# Patient Record
Sex: Female | Born: 1980 | Hispanic: Yes | Marital: Married | Smoking: Never smoker
Health system: Southern US, Community
[De-identification: ages and names within clinical notes are randomized; demographics above are authoritative.]

---

## 2017-01-04 ENCOUNTER — Other Ambulatory Visit (HOSPITAL_COMMUNITY)
Admission: RE | Admit: 2017-01-04 | Discharge: 2017-01-04 | Disposition: A | Discharge status: Home / Self Care | Payer: BLUE CROSS/BLUE SHIELD | Source: Ambulatory Visit | Attending: Family Medicine | Admitting: Family Medicine

## 2017-01-04 ENCOUNTER — Encounter: Payer: Self-pay | Admitting: Family Medicine

## 2017-01-04 ENCOUNTER — Ambulatory Visit: Payer: BLUE CROSS/BLUE SHIELD | Admitting: Family Medicine

## 2017-01-04 VITALS — BP 118/78 | HR 94 | Temp 98.5°F | Ht 59.0 in | Wt 101.5 lb

## 2017-01-04 DIAGNOSIS — R1032 Left lower quadrant pain: Secondary | ICD-10-CM

## 2017-01-04 DIAGNOSIS — R011 Cardiac murmur, unspecified: Secondary | ICD-10-CM | POA: Diagnosis not present

## 2017-01-04 DIAGNOSIS — R1030 Lower abdominal pain, unspecified: Secondary | ICD-10-CM | POA: Insufficient documentation

## 2017-01-04 LAB — URINALYSIS, ROUTINE W REFLEX MICROSCOPIC
Bilirubin Urine: NEGATIVE
Hgb urine dipstick: NEGATIVE
Ketones, ur: NEGATIVE
LEUKOCYTES UA: NEGATIVE
Nitrite: NEGATIVE
PH: 7.5 (ref 5.0–8.0)
Specific Gravity, Urine: 1.015 (ref 1.000–1.030)
TOTAL PROTEIN, URINE-UPE24: NEGATIVE
Urine Glucose: NEGATIVE
Urobilinogen, UA: 0.2 (ref 0.0–1.0)

## 2017-01-04 LAB — CBC
HCT: 41.6 % (ref 36.0–46.0)
HEMOGLOBIN: 13.5 g/dL (ref 12.0–15.0)
MCHC: 32.5 g/dL (ref 30.0–36.0)
MCV: 89.2 fl (ref 78.0–100.0)
Platelets: 225 10*3/uL (ref 150.0–400.0)
RBC: 4.66 Mil/uL (ref 3.87–5.11)
RDW: 14.1 % (ref 11.5–15.5)
WBC: 5.5 10*3/uL (ref 4.0–10.5)

## 2017-01-04 LAB — COMPREHENSIVE METABOLIC PANEL
ALK PHOS: 63 U/L (ref 39–117)
ALT: 12 U/L (ref 0–35)
AST: 14 U/L (ref 0–37)
Albumin: 4.9 g/dL (ref 3.5–5.2)
BILIRUBIN TOTAL: 0.8 mg/dL (ref 0.2–1.2)
BUN: 10 mg/dL (ref 6–23)
CO2: 30 mEq/L (ref 19–32)
Calcium: 9.7 mg/dL (ref 8.4–10.5)
Chloride: 102 mEq/L (ref 96–112)
Creatinine, Ser: 0.58 mg/dL (ref 0.40–1.20)
GFR: 123.27 mL/min (ref >60.00)
GLUCOSE: 96 mg/dL (ref 70–99)
Potassium: 4.2 mEq/L (ref 3.5–5.1)
Sodium: 137 mEq/L (ref 135–145)
TOTAL PROTEIN: 8.3 g/dL (ref 6.0–8.3)

## 2017-01-04 LAB — POCT URINE PREGNANCY: PREG TEST UR: NEGATIVE

## 2017-01-04 NOTE — Progress Notes (Signed)
Subjective:  Patient ID: Makayla MorganVivian Hart, female    DOB: Jun 03, 1980  Age: 36 y.o. MRN: [id number]  CC: Abdominal Pain and Back Pain   HPI Makayla Hart presents for evaluation of a week long history of left lower abdominal pain.  She denies nausea/vomiting, diarrhea, constipation, blood in her stool, melena or other changes in her bowel habits.  There is been no dysuria, urinary frequency, or vaginal discharge.  Her cycles usually started the first of the month and she has not had a cycle this month.  She has not been active sexually with her husband since her last menses.  She admits to being a little sad or blue over the last month or 2.  She denies any thoughts of self harm.  We asked her to fill out a PHQ 9.  Her son is now 38 years old but she says that he was in the hospital for 4 months with a blood dyscrasia that involved his platelets.  He is now doing well.  She was an avid runner before this pregnancy.  She is actually run marathons in the past.  She misses running.  She has no history of a heart murmur that she is aware of.  She has no chest pain or shortness of breath.  She does not smoke, drink alcohol or use illicit drugs.  She has a stable relationship with her husband.  She has an appointment with Dr. Allena KatzPatel her GYN provider on January 15.  History Makayla Hart has no past medical history on file.   She has no past surgical history on file.   Her family history is not on file.She reports that  has never smoked. she has never used smokeless tobacco. Her alcohol and drug histories are not on file.  No outpatient medications prior to visit.   No facility-administered medications prior to visit.     ROS Review of Systems  Constitutional: Negative for chills, diaphoresis, fatigue and unexpected weight change.  HENT: Negative.   Eyes: Negative.   Respiratory: Negative.  Negative for chest tightness and shortness of breath.   Cardiovascular: Negative for chest pain.  Gastrointestinal:  Positive for abdominal pain. Negative for abdominal distention, anal bleeding, blood in stool, constipation, diarrhea, nausea and vomiting.  Endocrine: Negative for polyphagia and polyuria.  Genitourinary: Negative for dysuria, frequency, genital sores, hematuria and vaginal discharge.  Musculoskeletal: Negative for arthralgias, gait problem and myalgias.  Skin: Negative for rash and wound.  Allergic/Immunologic: Negative for immunocompromised state.  Neurological: Negative for weakness and headaches.  Hematological: Does not bruise/bleed easily.  Psychiatric/Behavioral: Positive for dysphoric mood. Negative for behavioral problems, confusion, decreased concentration, hallucinations, self-injury and suicidal ideas.    Objective:  BP 118/78 (BP Location: Left Arm, Patient Position: Sitting, Cuff Size: Normal)   Pulse 94   Temp 98.5 F (36.9 C) (Oral)   Ht 4\' 11"  (1.499 m)   Wt 101 lb 8 oz (46 kg)   SpO2 97%   BMI 20.50 kg/m   Physical Exam  Constitutional: She is oriented to person, place, and time. She appears well-developed and well-nourished. No distress.  HENT:  Head: Normocephalic and atraumatic.  Right Ear: External ear normal.  Left Ear: External ear normal.  Mouth/Throat: Oropharynx is clear and moist. No oropharyngeal exudate.  Eyes: Conjunctivae are normal. Pupils are equal, round, and reactive to light. Right eye exhibits no discharge. Left eye exhibits no discharge. No scleral icterus.  Neck: Neck supple. No JVD present. No tracheal deviation present. No  thyromegaly present.  Cardiovascular: Normal rate and regular rhythm.  Murmur heard.  Decrescendo systolic murmur is present with a grade of 2/6. Early systolic murmur noted at left upper sternal border.  Pulmonary/Chest: Effort normal and breath sounds normal. No stridor.  Abdominal: Soft. Bowel sounds are normal. She exhibits no distension. There is no tenderness. There is no rebound and no guarding.    Lymphadenopathy:    She has no cervical adenopathy.  Neurological: She is alert and oriented to person, place, and time.  Skin: Skin is warm and dry. No rash noted. She is not diaphoretic. No erythema.  Psychiatric: She has a normal mood and affect. Her behavior is normal.      Assessment & Plan:   Makayla Hart was seen today for abdominal pain and back pain.  Diagnoses and all orders for this visit:  Left lower quadrant pain -     Urine cytology ancillary only -     POCT urine pregnancy -     CBC -     Comprehensive metabolic panel -     Urinalysis, Routine w reflex microscopic -     HIV antibody (with reflex)  Heart murmur -     Ambulatory referral to Cardiology   Makayla MorganVivian Hart does not currently have medications on file.  No orders of the defined types were placed in this encounter.    Follow-up: Return in about 1 week (around 01/11/2017).  Mliss SaxWilliam Alfred Kremer, MD

## 2017-01-05 LAB — URINE CYTOLOGY ANCILLARY ONLY
Chlamydia: NEGATIVE
Neisseria Gonorrhea: NEGATIVE
Trichomonas: NEGATIVE

## 2017-01-05 LAB — HIV ANTIBODY (ROUTINE TESTING W REFLEX): HIV: NONREACTIVE

## 2017-01-08 ENCOUNTER — Telehealth: Payer: Self-pay | Admitting: Family Medicine

## 2017-01-08 ENCOUNTER — Encounter: Payer: Self-pay | Admitting: Nurse Practitioner

## 2017-01-08 ENCOUNTER — Ambulatory Visit: Payer: Self-pay | Admitting: *Deleted

## 2017-01-08 ENCOUNTER — Ambulatory Visit: Payer: BLUE CROSS/BLUE SHIELD | Admitting: Nurse Practitioner

## 2017-01-08 VITALS — BP 110/70 | HR 67 | Temp 98.2°F | Ht 59.0 in | Wt 101.0 lb

## 2017-01-08 DIAGNOSIS — R1032 Left lower quadrant pain: Secondary | ICD-10-CM

## 2017-01-08 NOTE — Telephone Encounter (Signed)
Pt complain of pain in her belly button down her left side; pt states that she feels weak and feels bloated if she eats too much; pt also states that she has a watery discharge from her vagina; pt states that symptoms started when she had intercourse with her husband; she states that a cyst popped up on the side of her vagina but it went away; pt has appointment with Dr Doreene BurkeKremer on 01/08/17; however she would like to be seen today a and would also like to have a pelvic exam and swabs; she states that she is concerned about pelvic organ damage; pt offered and accepted an appointment 01/08/17 at 1000 with Alysia Pennaharlotte Nche; pt verbalizes understanding; spoke with Arrie Eastern[facility] pool regarding this appointment.      Reason for Disposition . Unusual vaginal discharge (e.g., bad smelling, yellow, green, or foamy-white)  Answer Assessment - Initial Assessment Questions 1. LOCATION: "Where does it hurt?"      Starts at belly button 2. RADIATION: "Does the pain shoot anywhere else?" (e.g., chest, back)     Down left side 3. ONSET: "When did the pain begin?" (e.g., minutes, hours or days ago)      Weeks ago 4. SUDDEN: "Gradual or sudden onset?"     gradual 5. PATTERN "Does the pain come and go, or is it constant?"    - If constant: "Is it getting better, staying the same, or worsening?"      (Note: Constant means the pain never goes away completely; most serious pain is constant and it progresses)     - If intermittent: "How long does it last?" "Do you have pain now?"     (Note: Intermittent means the pain goes away completely between bouts)     constant 6. SEVERITY: "How bad is the pain?"  (e.g., Scale 1-10; mild, moderate, or severe)   - MILD (1-3): doesn't interfere with normal activities, abdomen soft and not tender to touch    - MODERATE (4-7): interferes with normal activities or awakens from sleep, tender to touch    - SEVERE (8-10): excruciating pain, doubled over, unable to do any normal  activities      moderate 7. RECURRENT SYMPTOM: "Have you ever had this type of abdominal pain before?" If so, ask: "When was the last time?" and "What happened that time?"      no 8. CAUSE: "What do you think is causing the abdominal pain?"     ? STD 9. RELIEVING/AGGRAVATING FACTORS: "What makes it better or worse?" (e.g., movement, antacids, bowel movement)     Standing and walking 10. OTHER SYMPTOMS: "Has there been any vomiting, diarrhea, constipation, or urine problems?"       gas 11. PREGNANCY: "Is there any chance you are pregnant?" "When was your last menstrual period?"       No; seen in MD office on Thursday and negative pregnancy test  Protocols used: [facility]

## 2017-01-08 NOTE — Telephone Encounter (Signed)
Copied from CRM [id number]. Topic: Quick Communication - See Telephone Encounter >> Jan 08, 2017  8:08 AM Diana EvesHoyt, Maryann B wrote: CRM for notification. See Telephone encounter for:  Pt would like to have a call about her lab results.  01/08/17.

## 2017-01-08 NOTE — Progress Notes (Signed)
   Subjective:  Patient ID: Makayla MorganVivian Hart, female    DOB: March 04, 1980  Age: [age] y.o. MRN: [id number]  CC: Abdominal Pain (lower abd pain,discomfort,burning sensation,tender, worse on left side,had discharge with odor--going on for 2 mo--concern about infection--)   HPI  LLQ Abd pain x 2months: Intermittent. Reports vaginal discharge and odor at onset of symptoms (both resolved now) Unable to describe type of pain. No OTC medication used. Intercourse with husband only. No pain with intercourse. Reports she stopped having intercourse after having her 1131yrs old son. She resumed 2months ago. She is concerned about PID secondary to STI No intercourse in last 2months. Last LMP: approximately second week of November. Did not have a cycle this month. No constipation, no diarrhea No vaginal odor. No fever.  No outpatient medications prior to visit.   No facility-administered medications prior to visit.     ROS See HPI  Objective:  BP 110/70   Pulse 67   Temp 98.2 F (36.8 C)   Ht 4\' 11"  (1.499 m)   Wt 101 lb (45.8 kg)   LMP 11/20/2016 (Approximate) Comment: abstinence  SpO2 97%   BMI 20.40 kg/m   BP Readings from Last 3 Encounters:  01/08/17 110/70  01/04/17 118/78    Wt Readings from Last 3 Encounters:  01/08/17 101 lb (45.8 kg)  01/04/17 101 lb 8 oz (46 kg)    Physical Exam  Constitutional: She is oriented to person, place, and time. No distress.  Cardiovascular: Normal rate.  Pulmonary/Chest: Effort normal.  Abdominal: Soft. Bowel sounds are normal. She exhibits no distension. There is no tenderness. There is no guarding.  Musculoskeletal: She exhibits no edema.  Neurological: She is alert and oriented to person, place, and time.  Skin: Skin is warm and dry. No rash noted. No erythema.  Vitals reviewed.   Lab Results  Component Value Date   WBC 5.5 01/04/2017   HGB 13.5 01/04/2017   HCT 41.6 01/04/2017   PLT 225.0 01/04/2017   GLUCOSE 96 01/04/2017   ALT 12  01/04/2017   AST 14 01/04/2017   NA 137 01/04/2017   K 4.2 01/04/2017   CL 102 01/04/2017   CREATININE 0.58 01/04/2017   BUN 10 01/04/2017   CO2 30 01/04/2017    Assessment & Plan:   Makayla Hart was seen today for abdominal pain.  Diagnoses and all orders for this visit:  Left lower quadrant pain  I reviewed lab results with patient. I spent 30mins with Makayla Hart. 90% of the office visit was spent counseling patient labs results, Symptoms of PID, and other possible differential diagnosis (musculoskeletal strain or BV or pain related to ovulation). I offered to perform pelvic exam and wet prep. Makayla Hart declined, stating she has an appointment with GYN 01/30/17. I advised her to return to office if symptoms change or worsen. Advised to consider use of tylenol or ibuprofen for pain as needed.  Makayla MorganVivian Hart does not currently have medications on file.  No orders of the defined types were placed in this encounter.  Follow-up: Return if symptoms worsen or fail to improve.  Alysia Pennaharlotte Netasha Wehrli, NP

## 2017-01-08 NOTE — Telephone Encounter (Signed)
Noted! Thank you

## 2017-01-08 NOTE — Telephone Encounter (Signed)
I left a voicemail for patient letting her know labs are normal & to call back with any questions. CRM created & [facility].

## 2017-01-11 ENCOUNTER — Ambulatory Visit: Payer: BLUE CROSS/BLUE SHIELD | Admitting: Family Medicine

## 2017-01-11 LAB — URINE CYTOLOGY ANCILLARY ONLY: BACTERIAL VAGINITIS: NEGATIVE

## 2017-01-19 ENCOUNTER — Ambulatory Visit: Payer: BLUE CROSS/BLUE SHIELD | Admitting: Family Medicine

## 2017-01-19 ENCOUNTER — Encounter: Payer: Self-pay | Admitting: Family Medicine

## 2017-01-19 VITALS — BP 100/68 | HR 73 | Ht 59.0 in | Wt 99.2 lb

## 2017-01-19 DIAGNOSIS — R1032 Left lower quadrant pain: Secondary | ICD-10-CM | POA: Diagnosis not present

## 2017-01-19 NOTE — Progress Notes (Signed)
   Subjective:  Patient ID: Makayla Hart [place], female    DOB: [birth year]  Age: 37 y.o. MRN: [id number]  CC: Follow-up   HPI Makayla Hart Martucci presents  follow-up of left lower quadrant pain.  He continues to improve.  She denies fevers, chills, melena or bloody stool, or voiding symptoms.  She did see her GYN and had Pap and underwent a pelvic ultrasound.  Pap and pelvic ultrasound were normal.  Testing for STDs was negative she has no family history of colon cancer or diverticular disease.  She denies depression.  No outpatient medications prior to visit.   No facility-administered medications prior to visit.     ROS Review of Systems  Constitutional: Negative.   Respiratory: Negative.   Cardiovascular: Negative.   Gastrointestinal: Negative.  Negative for anal bleeding and blood in stool.  Endocrine: Negative for polyphagia and polyuria.  Genitourinary: Negative.  Negative for dyspareunia, dysuria, frequency, hematuria and vaginal discharge.  Psychiatric/Behavioral: Negative for dysphoric mood. The patient is not nervous/anxious.     Objective:  BP 100/68 (BP Location: Left Arm, Patient Position: Sitting, Cuff Size: Normal)   Pulse 73   Ht 4\' 11"  (1.499 m)   Wt 99 lb 4 oz (45 kg)   SpO2 96%   BMI 20.05 kg/m   BP Readings from Last 3 Encounters:  01/19/17 100/68  01/08/17 110/70  01/04/17 118/78    Wt Readings from Last 3 Encounters:  01/19/17 99 lb 4 oz (45 kg)  01/08/17 101 lb (45.8 kg)  01/04/17 101 lb 8 oz (46 kg)    Physical Exam  Constitutional: She is oriented to person, place, and time. She appears well-developed and well-nourished. No distress.  Cardiovascular: Normal rate and regular rhythm.  Murmur heard. Pulmonary/Chest: Effort normal and breath sounds normal.  Abdominal: Soft. Bowel sounds are normal. She exhibits no distension and no mass. There is no tenderness. There is no rebound and no guarding. Hernia confirmed negative in the right inguinal area and  confirmed negative in the left inguinal area.  Lymphadenopathy:       Right: No inguinal adenopathy present.       Left: No inguinal adenopathy present.  Neurological: She is alert and oriented to person, place, and time.  Skin: Skin is warm and dry. She is not diaphoretic.  Psychiatric: She has a normal mood and affect. Her behavior is normal.   I encouraged her to become more active and to begin to run again.  She will follow-up in 2 weeks as needed. Lab Results  Component Value Date   WBC 5.5 01/04/2017   HGB 13.5 01/04/2017   HCT 41.6 01/04/2017   PLT 225.0 01/04/2017   GLUCOSE 96 01/04/2017   ALT 12 01/04/2017   AST 14 01/04/2017   NA 137 01/04/2017   K 4.2 01/04/2017   CL 102 01/04/2017   CREATININE 0.58 01/04/2017   BUN 10 01/04/2017   CO2 30 01/04/2017    No results found.  Assessment & Plan:   Maureen RalphsVivian was seen today for follow-up.  Diagnoses and all orders for this visit:  Left lower quadrant pain   Makayla Hart Po[place] does not currently have medications on file.  No orders of the defined types were placed in this encounter.    Follow-up: No Follow-up on file.  Mliss SaxWilliam Alfred Monte Bronder, MD

## 2017-01-26 ENCOUNTER — Ambulatory Visit: Payer: BLUE CROSS/BLUE SHIELD | Admitting: Internal Medicine

## 2017-02-01 ENCOUNTER — Telehealth: Payer: Self-pay

## 2017-02-01 NOTE — Telephone Encounter (Signed)
Patient's husband would like to talk with you before patient's appointment.    Copied from CRM [id number]. Topic: General - Other >> Feb 01, 2017  2:04 PM Gerrianne ScalePayne, Angela L wrote: Reason for CRM: patient Husband Thayer OhmChris would like for Dr Doreene BurkeKremer to give him a call before his wife appointment tomorrow he would like to give the Doctor some in site on wives condition   Husband Thayer OhmChris phone number [phone]

## 2017-02-02 ENCOUNTER — Other Ambulatory Visit (HOSPITAL_COMMUNITY)
Admission: RE | Admit: 2017-02-02 | Discharge: 2017-02-02 | Disposition: A | Discharge status: Home / Self Care | Payer: BLUE CROSS/BLUE SHIELD | Source: Ambulatory Visit | Attending: Family Medicine | Admitting: Family Medicine

## 2017-02-02 ENCOUNTER — Encounter: Payer: Self-pay | Admitting: Family Medicine

## 2017-02-02 ENCOUNTER — Encounter: Payer: Self-pay | Admitting: Gastroenterology

## 2017-02-02 ENCOUNTER — Ambulatory Visit: Payer: BLUE CROSS/BLUE SHIELD | Admitting: Family Medicine

## 2017-02-02 VITALS — BP 112/54 | HR 64 | Temp 98.1°F | Ht 59.0 in | Wt 99.2 lb

## 2017-02-02 DIAGNOSIS — R1030 Lower abdominal pain, unspecified: Secondary | ICD-10-CM | POA: Diagnosis not present

## 2017-02-02 DIAGNOSIS — F341 Dysthymic disorder: Secondary | ICD-10-CM | POA: Diagnosis not present

## 2017-02-02 LAB — COMPREHENSIVE METABOLIC PANEL
ALBUMIN: 4.6 g/dL (ref 3.5–5.2)
ALT: 14 U/L (ref 0–35)
AST: 15 U/L (ref 0–37)
Alkaline Phosphatase: 55 U/L (ref 39–117)
BUN: 10 mg/dL (ref 6–23)
CHLORIDE: 102 meq/L (ref 96–112)
CO2: 29 mEq/L (ref 19–32)
CREATININE: 0.56 mg/dL (ref 0.40–1.20)
Calcium: 9.8 mg/dL (ref 8.4–10.5)
GFR: 128.31 mL/min (ref >60.00)
Glucose, Bld: 97 mg/dL (ref 70–99)
POTASSIUM: 5 meq/L (ref 3.5–5.1)
Sodium: 138 mEq/L (ref 135–145)
TOTAL PROTEIN: 7.6 g/dL (ref 6.0–8.3)
Total Bilirubin: 0.8 mg/dL (ref 0.2–1.2)

## 2017-02-02 LAB — CBC
HCT: 40.3 % (ref 36.0–46.0)
Hemoglobin: 13.3 g/dL (ref 12.0–15.0)
MCHC: 33.1 g/dL (ref 30.0–36.0)
MCV: 87.5 fl (ref 78.0–100.0)
PLATELETS: 229 10*3/uL (ref 150.0–400.0)
RBC: 4.61 Mil/uL (ref 3.87–5.11)
RDW: 14.2 % (ref 11.5–15.5)
WBC: 4.6 10*3/uL (ref 4.0–10.5)

## 2017-02-02 LAB — URINALYSIS, ROUTINE W REFLEX MICROSCOPIC
Bilirubin Urine: NEGATIVE
Hgb urine dipstick: NEGATIVE
Ketones, ur: NEGATIVE
Leukocytes, UA: NEGATIVE
Nitrite: NEGATIVE
RBC / HPF: NONE SEEN (ref 0–?)
SPECIFIC GRAVITY, URINE: 1.01 (ref 1.000–1.030)
TOTAL PROTEIN, URINE-UPE24: NEGATIVE
URINE GLUCOSE: NEGATIVE
Urobilinogen, UA: 0.2 (ref 0.0–1.0)
pH: 6.5 (ref 5.0–8.0)

## 2017-02-02 MED ORDER — AMOXICILLIN 500 MG PO CAPS: 500.0000 mg | ORAL_CAPSULE | Freq: Three times a day (TID) | ORAL | 0 refills | Status: DC

## 2017-02-02 NOTE — Progress Notes (Signed)
Subjective:  Patient ID: Makayla Hart, female    DOB: [birth year]  Age: 37 y.o. MRN: [id number]  CC: Follow-up (Patient is here today to follow-up with lower abd pain.  The pain has decreased on the LLQ (still present) but is intense on the RLQ. Denies any constipation or diarrhea.  She does states that she has increased hydration because of some left sided flank pain.)   HPI Makayla MorganVivian Bessire presents for follow-up of her ongoing lower abdominal pain and dysthymia.  She tells that the lower abdominal pain seems to have moved from the left side to the right side.  Her stooling patterns have not changed at all.  There is been no blood or pus in her stool.  She is still thinks there may be an unusual smell to her normal vaginal lobe.  She has been celibate for the past 3 months.  GYN checks, pelvic ultrasound and probes for GC" have all been negative.  Movement seems to bother her pain.  Rest seems to help it.  She said that she had taken her little boy for swimming lessons and thinks that he may have gotten strep throat from a swimming pool.  She wonders about a strep infection herself.  There is been no fever chills nausea vomiting or headache.  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  LMP was on the seventh of this month and unusually heavy. No outpatient medications prior to visit.   No facility-administered medications prior to visit.     ROS Review of Systems  Constitutional: Negative.   Respiratory: Negative.   Cardiovascular: Negative.   Gastrointestinal: Positive for abdominal pain. Negative for anal bleeding, blood in stool, constipation, diarrhea, nausea, rectal pain and vomiting.  Genitourinary: Positive for vaginal discharge. Negative for dysuria, hematuria and vaginal bleeding.  Musculoskeletal: Negative for myalgias.  Hematological: Does not bruise/bleed easily.  Psychiatric/Behavioral: Positive for dysphoric mood.     Objective:  BP (!) 112/54 (BP Location: Right Arm, Patient Position: Sitting, Cuff Size: Normal)   Pulse 64   Temp 98.1 F (36.7 C) (Oral)   Ht 4\' 11"  (1.499 m)   Wt 99 lb 3.2 oz (45 kg)   LMP 01/22/2017   SpO2 98%   BMI 20.04 kg/m   BP Readings from Last 3 Encounters:  02/02/17 (!) 112/54  01/19/17 100/68  01/08/17 110/70    Wt Readings from Last 3 Encounters:  02/02/17 99 lb 3.2 oz (45 kg)  01/19/17 99 lb 4 oz (45 kg)  01/08/17 101 lb (45.8 kg)    Physical Exam  Constitutional: She is oriented to person, place, and time. She appears well-developed and well-nourished. No distress.  HENT:  Head: Normocephalic and atraumatic.  Right Ear: External ear normal.  Left Ear: External ear normal.  Eyes: Right eye exhibits no discharge. Left eye exhibits no discharge. No scleral icterus.  Pulmonary/Chest: Effort normal.  Neurological: She is alert and oriented to person, place, and time.  Skin: Skin is warm and dry. She is not diaphoretic.  Psychiatric: She has a normal mood and affect. Her behavior is normal.    Lab Results  Component Value Date   WBC 5.5 01/04/2017   HGB 13.5 01/04/2017   HCT 41.6 01/04/2017   PLT 225.0 01/04/2017   GLUCOSE 96 01/04/2017   ALT 12 01/04/2017   AST 14 01/04/2017   NA 137 01/04/2017   K 4.2 01/04/2017  CL 102 01/04/2017   CREATININE 0.58 01/04/2017   BUN 10 01/04/2017   CO2 30 01/04/2017    No results found.  Assessment & Plan:   Connee was seen today for follow-up.  Diagnoses and all orders for this visit:  Lower abdominal pain -     CBC -     Comprehensive metabolic panel -     Urinalysis, Routine w reflex microscopic -     Urine Culture -     Urine cytology ancillary only -     Ambulatory referral to Gastroenterology -     amoxicillin (AMOXIL) 500 MG capsule; Take 1 capsule (500 mg total) by mouth 3 (three) times daily.  Dysthymia -     Ambulatory referral to Psychology   I am having Makayla Morgan start on  amoxicillin.  Meds ordered this encounter  Medications  . amoxicillin (AMOXIL) 500 MG capsule    Sig: Take 1 capsule (500 mg total) by mouth 3 (three) times daily.    Dispense:  30 capsule    Refill:  0     Follow-up: No Follow-up on file.  Mliss Sax, MD

## 2017-02-05 ENCOUNTER — Encounter: Payer: Self-pay | Admitting: Family Medicine

## 2017-02-05 LAB — URINE CYTOLOGY ANCILLARY ONLY
Bacterial vaginitis: NEGATIVE
CANDIDA VAGINITIS: NEGATIVE
Trichomonas: NEGATIVE

## 2017-02-05 LAB — URINE CULTURE
MICRO NUMBER: 90078385
SPECIMEN QUALITY:: ADEQUATE

## 2017-02-08 ENCOUNTER — Telehealth: Payer: Self-pay | Admitting: Family Medicine

## 2017-02-08 NOTE — Telephone Encounter (Signed)
Patient is aware of message below.

## 2017-02-08 NOTE — Telephone Encounter (Signed)
Called in inquiring about the antibiotic (Amoxicillin) being appropriate for the E Coli infection in her urine. "I've been on the Internet reading about the E Coli and I just wonder if I'm on the right antibiotic".     She got the antibiotic filled but has not started taking it.  She requested we double check with Dr. Doreene BurkeKremer before she starts the antibiotic.  I routed a note to Dr. Evangeline GulaKremer's nurse pool making him aware of the situation.

## 2017-02-08 NOTE — Telephone Encounter (Signed)
In this case it can be used. The isolated bacteria was sensitive to the amoxil. Please take it.

## 2017-02-08 NOTE — Telephone Encounter (Signed)
I left a voicemail for patient to call the office back. See Dr. Evangeline GulaKremer's note below. Crm created.

## 2017-02-16 ENCOUNTER — Ambulatory Visit: Payer: BLUE CROSS/BLUE SHIELD | Admitting: Gastroenterology

## 2017-02-16 ENCOUNTER — Encounter: Payer: Self-pay | Admitting: Gastroenterology

## 2017-02-16 VITALS — BP 90/60 | HR 68 | Ht 59.0 in | Wt 98.5 lb

## 2017-02-16 DIAGNOSIS — R140 Abdominal distension (gaseous): Secondary | ICD-10-CM

## 2017-02-16 DIAGNOSIS — R1030 Lower abdominal pain, unspecified: Secondary | ICD-10-CM

## 2017-02-16 NOTE — Patient Instructions (Signed)
You have been scheduled for an abdominal ultrasound at [facility] in the Radiology department   on 02/20/2017 at 9:00am. Please arrive 15 minutes prior to your appointment for registration. Make certain not to have anything to eat or drink 6 hours prior to your appointment. Should you need to reschedule your appointment, please contact radiology at [phone]. This test typically takes about 30 minutes to perform.  Take over the counter IBGard 1 capsule three times a day  Follow up as needed   If you are age 39 or older, your body mass index should be between 23-30. Your Body mass index is 19.89 kg/m. If this is out of the aforementioned range listed, please consider follow up with your Primary Care Provider.  If you are age 39 or younger, your body mass index should be between 19-25. Your Body mass index is 19.89 kg/m. If this is out of the aformentioned range listed, please consider follow up with your Primary Care Provider.    Thank you Marsa ArisKavitha Nandigam, MD

## 2017-02-16 NOTE — Progress Notes (Signed)
Makayla Hart    [id number]    02/24/1980  Primary Care Physician:Kremer, Talmadge CoventryWilliam Alfred, MD  Referring Physician: Mliss SaxKremer, William Alfred, MD [address] [place], [place] [address]  Chief complaint: Lower abdominal pain  HPI: 37 year old female patient visit with complaints of lower abdominal pain for past few weeks.  She had positive E. coli in the urine culture and is currently on antibiotics, she had some improvement of pain once she started taking antibiotics but continues to have the discomfort.  The pain radiates from suprapubic area in the lower abdomen to her back.  Denies any change in bowel habits.  Denies any constipation or diarrhea.  No melena or blood per rectum.  No dysphagia, heartburn, odynophagia, nausea or vomiting .  No fever or chills. When she strains excessively she occasionally feels hemorrhoids.  She is currently having regular bowel movements every day or every other day.  No rectal bleeding or other symptoms associated with hemorrhoids. No family history of IBD or colon cancer. She is planning to conceive and feels her symptoms started since she started having intercourse with her husband without condoms  Outpatient Encounter Medications as of 02/16/2017  Medication Sig  . amoxicillin (AMOXIL) 500 MG capsule Take 1 capsule (500 mg total) by mouth 3 (three) times daily.   No facility-administered encounter medications on file as of 02/16/2017.     Allergies as of 02/16/2017  . (No Known Allergies)    History reviewed. No pertinent past medical history.  No past surgical history on file.  Family History  Problem Relation Age of Onset  . Colon cancer Neg Hx   . Heart disease Neg Hx   . Stroke Neg Hx   . Diabetes Neg Hx     Social History   Socioeconomic History  . Marital status: Married    Spouse name: Not on file  . Number of children: 1  . Years of education: Not on file  . Highest education level: Not on file  Social Needs   . Financial resource strain: Not on file  . Food insecurity - worry: Not on file  . Food insecurity - inability: Not on file  . Transportation needs - medical: Not on file  . Transportation needs - non-medical: Not on file  Occupational History  . Not on file  Tobacco Use  . Smoking status: Never Smoker  . Smokeless tobacco: Never Used  Substance and Sexual Activity  . Alcohol use: No    Frequency: Never  . Drug use: No  . Sexual activity: Yes    Partners: Male    Birth control/protection: None  Other Topics Concern  . Not on file  Social History Narrative  . Not on file      Review of systems: Review of Systems  Constitutional: Negative for fever and chills.  HENT: Negative.   Eyes: Negative for blurred vision.  Respiratory: Negative for cough, shortness of breath and wheezing.   Cardiovascular: Negative for chest pain and palpitations.  Gastrointestinal: as per HPI Genitourinary: Negative for dysuria, urgency, frequency and hematuria.  Musculoskeletal: Negative for myalgias, back pain and joint pain.  Skin: Negative for itching and rash.  Neurological: Negative for dizziness, tremors, focal weakness, seizures and loss of consciousness.  Endo/Heme/Allergies: Positive for seasonal allergies.  Psychiatric/Behavioral: Negative for depression, suicidal ideas and hallucinations.  All other systems reviewed and are negative.   Physical Exam: Vitals:   02/16/17 0922  BP:  90/60  Pulse: 68   Body mass index is 19.89 kg/m. Gen:      No acute distress HEENT:  EOMI, sclera anicteric Neck:     No masses; no thyromegaly Lungs:    Clear to auscultation bilaterally; normal respiratory effort CV:         Regular rate and rhythm; no murmurs Abd:      + bowel sounds; soft, non-tender; no palpable masses, no distension Ext:    No edema; adequate peripheral perfusion Skin:      Warm and dry; no rash Neuro: alert and oriented x 3 Psych: normal mood and affect  Data  Reviewed:  Reviewed labs, radiology imaging, old records and pertinent past GI work up   Assessment and Plan/Recommendations:  37 year old female here with complaints of lower abdominal pain radiating to her back.  Positive urine culture with E. coli, currently on antibiotics. Reviewed recent labs, unremarkable. Her symptoms are likely secondary to UTI She has no specific GI complaints other than lower abdominal pain in the suprapubic region Obtain abdominal ultrasound to evaluate IB gard (peppermint oil) 1 capsule up to 3 times daily as needed for abdominal bloating and discomfort Return as needed   K. Scherry Ran , MD [phone] Mon-Fri 8a-5p [phone] after 5p, weekends, holidays  CC: Mliss Sax,*

## 2017-02-20 ENCOUNTER — Ambulatory Visit (HOSPITAL_BASED_OUTPATIENT_CLINIC_OR_DEPARTMENT_OTHER): Payer: BLUE CROSS/BLUE SHIELD

## 2017-02-23 ENCOUNTER — Telehealth: Payer: Self-pay

## 2017-02-23 NOTE — Telephone Encounter (Signed)
Patient would [facility] to [facility] because it will be cheaper than having [facility].

## 2017-02-26 ENCOUNTER — Ambulatory Visit (HOSPITAL_BASED_OUTPATIENT_CLINIC_OR_DEPARTMENT_OTHER): Payer: BLUE CROSS/BLUE SHIELD

## 2017-02-26 ENCOUNTER — Telehealth: Payer: Self-pay | Admitting: Gastroenterology

## 2017-02-26 DIAGNOSIS — R1030 Lower abdominal pain, unspecified: Secondary | ICD-10-CM

## 2017-02-26 NOTE — Telephone Encounter (Signed)
See previous phone note. Patient is requesting her ultrasound be rescheduled from [facility] to [facility] due to her insurance. [facility] [address] on 03/08/17 at 8:45am. Patient informed of date and time of appt and nothing to eat after midnight before ultrasound. Patient verbalized understanding.

## 2017-02-26 NOTE — Telephone Encounter (Signed)
Left a message for patient to return my call. 

## 2017-02-26 NOTE — Telephone Encounter (Signed)
See phone note from 02/26/17.

## 2017-02-27 ENCOUNTER — Encounter: Payer: Self-pay | Admitting: Internal Medicine

## 2017-02-27 ENCOUNTER — Telehealth: Payer: Self-pay | Admitting: Internal Medicine

## 2017-02-27 ENCOUNTER — Ambulatory Visit: Payer: BLUE CROSS/BLUE SHIELD | Admitting: Internal Medicine

## 2017-02-27 VITALS — BP 114/72 | HR 60 | Ht 59.0 in | Wt 97.0 lb

## 2017-02-27 DIAGNOSIS — R011 Cardiac murmur, unspecified: Secondary | ICD-10-CM | POA: Diagnosis not present

## 2017-02-27 DIAGNOSIS — I4510 Unspecified right bundle-branch block: Secondary | ICD-10-CM | POA: Insufficient documentation

## 2017-02-27 NOTE — Telephone Encounter (Signed)
Steward DroneBrenda calling with [facility], states that patient needs procedure code for echo. Please leave message if she does not answer.

## 2017-02-27 NOTE — Progress Notes (Signed)
OFFICE CONSULT NOTE  Chief Complaint:  Murmur  Primary Care Physician: Mliss Sax, MD  HPI:  Makayla Hart is a 37 y.o. female who is being seen today for the evaluation of murmur at the request of Mliss Sax,*.  This is a pleasant 37 year old female who is kindly referred for evaluation of a new murmur.  She reports not being told she had a murmur in the past.  She has been very physically active.  She says she has been a runner since about age 42 through high school and in college.  She also ran marathons.  She backed off from the somewhat when she developed an eye problem that was worsened by her running around 2014 or 2015 at the time when she had her first child.  She is now contemplating having another child.  She has been struggling recently with some abdominal pain and is been seen by GI and was also treated for a vaginitis.  Her murmur was heard by her GYN and she was referred for evaluation.  She denies any history of valvular heart disease.  There is no segment heart disease in the family.  She was not told that she had a murmur as a child.  She had her first EKG today that she can recall.  This demonstrated sinus rhythm at 60 with possible left atrial enlargement and right bundle branch block.  Personally reviewed this.  PMHx:  No relevant past cardiac history  FAMHx:  Family History  Problem Relation Age of Onset  . Colon cancer Neg Hx   . Heart disease Neg Hx   . Stroke Neg Hx   . Diabetes Neg Hx     SOCHx:   reports that  has never smoked. she has never used smokeless tobacco. She reports that she does not drink alcohol or use drugs.  ALLERGIES:  No Known Allergies  ROS: Pertinent items noted in HPI and remainder of comprehensive ROS otherwise negative.  HOME MEDS: Current Outpatient Medications on File Prior to Visit  Medication Sig Dispense Refill  . metroNIDAZOLE (FLAGYL) 500 MG tablet Take 1 tablet by mouth 2 (two) times daily.  0  .  Norethindrone Acetate-Ethinyl Estrad-FE (LOESTRIN 24 FE) 1-20 MG-MCG(24) tablet Take 1 tablet by mouth as directed.     No current facility-administered medications on file prior to visit.     LABS/IMAGING: No results found for this or any previous visit (from the past 48 hour(s)). No results found.  LIPID PANEL: No results found for: CHOL, TRIG, HDL, CHOLHDL, VLDL, LDLCALC, LDLDIRECT  WEIGHTS: Wt Readings from Last 3 Encounters:  02/27/17 97 lb (44 kg)  02/16/17 98 lb 8 oz (44.7 kg)  02/02/17 99 lb 3.2 oz (45 kg)    VITALS: BP 114/72   Pulse 60   Ht 4\' 11"  (1.499 m)   Wt 97 lb (44 kg)   BMI 19.59 kg/m   EXAM: General appearance: alert, no distress and Thin Neck: no carotid bruit, no JVD and thyroid not enlarged, symmetric, no tenderness/mass/nodules Lungs: clear to auscultation bilaterally Heart: regular rate and rhythm, S1, S2 normal and systolic murmur: early systolic 2/6, crescendo at 2nd right intercostal space Abdomen: soft, non-tender; bowel sounds normal; no masses,  no organomegaly Extremities: extremities normal, atraumatic, no cyanosis or edema Pulses: 2+ and symmetric Skin: Skin color, texture, turgor normal. No rashes or lesions Neurologic: Grossly normal Psych: Pleasant  EKG: Normal sinus rhythm, possible left atrial enlargement, RBBB- personally reviewed  ASSESSMENT:  1. Murmur 2. Abnormal EKG, possible left atrial enlargement 3. RBBB  PLAN: 1.   Mrs. Burich presents with a murmur which is loudest over the pulmonic area.  She does have a right bundle branch block and I wonder if this could be a pulmonic stenosis.  Her EKG suggests also possible left atrial enlargement.  There may be a separate soft systolic murmur over the mitral area as well.  I recommend an echocardiogram to further evaluate this.  Plan follow-up with me afterwards.  Will be helpful risk stratify her for any future pregnancies.  Thanks for the kind referral.  Chrystie NoseKenneth C. Hilty, MD,  [facility]FACC, FACP  [place]  [facility]  Medical Director of the Advanced Lipid Disorders &  Cardiovascular Risk Reduction Clinic Diplomate of the American Board of Clinical Lipidology Attending Cardiologist  Direct Dial: [phone]  Fax: [phone]  Website:  [email or web address]  Kenneth C Hilty 02/27/2017, 8:55 AM

## 2017-02-27 NOTE — Patient Instructions (Addendum)
Your physician has requested that you have an echocardi[facility]. Echocardiography is a painless test that uses sound waves to create images of your heart. It provides your doctor with information about the size and shape of your heart and how well your heart's chambers and valves are working. This procedure takes approximately one hour. There are no restrictions for this procedure.  Your physician recommends that you schedule a follow-up appointment after your echocardiogram

## 2017-03-01 ENCOUNTER — Ambulatory Visit (HOSPITAL_COMMUNITY): Payer: BLUE CROSS/BLUE SHIELD

## 2017-03-01 NOTE — Telephone Encounter (Signed)
Note to scheduling [place]che[place].

## 2017-03-06 ENCOUNTER — Ambulatory Visit: Payer: BLUE CROSS/BLUE SHIELD | Admitting: Psychology

## 2017-03-06 DIAGNOSIS — F332 Major depressive disorder, recurrent severe without psychotic features: Secondary | ICD-10-CM

## 2017-03-06 NOTE — Telephone Encounter (Signed)
Returned call to patient's husband. He states patient has become increasingly worried about her echo appointment and she is now having "symptoms" of occasional chest discomfort, warm sensation in her chest. Husband would like to know if this echo needs to be done STAT. Explained that per MD note, she did not report these symptoms and and echo was to f/up on a murmur. 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 He is concerned this is anxiety but worried they could be overlooking something. Currently, echo is scheduled at [facility] on 2/26 d/t cost - will only have to pay a co-pay vs $1000 at [facility] per husband.   Advised would notify MD.   Advised to seek ED eval for CP episodes >30 mins, worsening intensity or more frequent episodes. Patient's husband Thayer OhmChris is aware MD is out of the office

## 2017-03-06 NOTE — Telephone Encounter (Signed)
New message     Patient husband wants to speak with Eileen StanfordJenna, because his wife was scheduled for an Echo and they changed it until 03/13/17 because they dont have to pay copay if they wait until 03/13/17.    Spoke with Hayley- order doesn't say that it is a stat and there wasn't anything stating that patient needed it stat.    Wife is not worried should they pay the full price of the echo instead of waiting for the insurance to cover it and have it done STAT.

## 2017-03-07 ENCOUNTER — Ambulatory Visit: Payer: BLUE CROSS/BLUE SHIELD | Admitting: Family Medicine

## 2017-03-07 NOTE — Telephone Encounter (Signed)
Called patient's husband and provided MD recommendations. He asked if patient could have endocarditis. Explained that her lab work from jan 2019 showed normal WBC. She has no signs of active infection, no fever. He states she has had issues with infections. Reiterated MD advice of ED eval for acute symptoms. He asked what all would be checked in ED and I advised that I cannot answer this definitively as that would be up to the EDP.

## 2017-03-07 NOTE — Telephone Encounter (Signed)
I agree with those recommendations - a STAT echo cannot rule-out ischemic chest pain. Would proceed as scheduled but go to ER if symptoms are worse.  Dr. HRexene Edison

## 2017-03-08 ENCOUNTER — Ambulatory Visit
Admission: RE | Admit: 2017-03-08 | Discharge: 2017-03-08 | Disposition: A | Discharge status: Home / Self Care | Payer: BLUE CROSS/BLUE SHIELD | Source: Ambulatory Visit | Attending: Gastroenterology | Admitting: Gastroenterology

## 2017-03-08 DIAGNOSIS — R1030 Lower abdominal pain, unspecified: Secondary | ICD-10-CM

## 2017-03-09 ENCOUNTER — Other Ambulatory Visit (HOSPITAL_COMMUNITY): Payer: BLUE CROSS/BLUE SHIELD

## 2017-03-13 ENCOUNTER — Other Ambulatory Visit: Payer: Self-pay

## 2017-03-13 ENCOUNTER — Ambulatory Visit (INDEPENDENT_AMBULATORY_CARE_PROVIDER_SITE_OTHER): Payer: BLUE CROSS/BLUE SHIELD

## 2017-03-13 ENCOUNTER — Ambulatory Visit: Payer: BLUE CROSS/BLUE SHIELD | Admitting: Psychology

## 2017-03-13 DIAGNOSIS — F332 Major depressive disorder, recurrent severe without psychotic features: Secondary | ICD-10-CM

## 2017-03-13 DIAGNOSIS — R011 Cardiac murmur, unspecified: Secondary | ICD-10-CM | POA: Diagnosis not present

## 2017-03-13 DIAGNOSIS — I4510 Unspecified right bundle-branch block: Secondary | ICD-10-CM

## 2017-03-19 ENCOUNTER — Ambulatory Visit (INDEPENDENT_AMBULATORY_CARE_PROVIDER_SITE_OTHER): Payer: 59 | Admitting: Psychology

## 2017-03-19 DIAGNOSIS — F332 Major depressive disorder, recurrent severe without psychotic features: Secondary | ICD-10-CM | POA: Diagnosis not present

## 2017-03-23 ENCOUNTER — Ambulatory Visit: Payer: BLUE CROSS/BLUE SHIELD | Admitting: Family Medicine

## 2017-03-26 ENCOUNTER — Ambulatory Visit: Payer: 59 | Admitting: Internal Medicine

## 2017-03-26 ENCOUNTER — Ambulatory Visit (INDEPENDENT_AMBULATORY_CARE_PROVIDER_SITE_OTHER): Payer: 59 | Admitting: Family Medicine

## 2017-03-26 ENCOUNTER — Encounter: Payer: Self-pay | Admitting: Family Medicine

## 2017-03-26 ENCOUNTER — Encounter: Payer: Self-pay | Admitting: Internal Medicine

## 2017-03-26 VITALS — BP 118/70 | HR 66 | Ht 59.0 in | Wt 99.4 lb

## 2017-03-26 VITALS — BP 110/68 | HR 78 | Ht 59.0 in | Wt 98.1 lb

## 2017-03-26 DIAGNOSIS — I4510 Unspecified right bundle-branch block: Secondary | ICD-10-CM

## 2017-03-26 DIAGNOSIS — R0789 Other chest pain: Secondary | ICD-10-CM | POA: Insufficient documentation

## 2017-03-26 DIAGNOSIS — S39011A Strain of muscle, fascia and tendon of abdomen, initial encounter: Secondary | ICD-10-CM

## 2017-03-26 DIAGNOSIS — R010 Benign and innocent cardiac murmurs: Secondary | ICD-10-CM | POA: Diagnosis not present

## 2017-03-26 NOTE — Progress Notes (Signed)
OFFICE CONSULT NOTE  Chief Complaint:  Follow-up echo  Primary Care Physician: Mliss Sax, MD  HPI:  Makayla Hart is a 37 y.o. female who is being seen today for the evaluation of murmur at the request of Mliss Sax,*.  This is a pleasant 37 year old female who is kindly referred for evaluation of a new murmur.  She reports not being told she had a murmur in the past.  She has been very physically active.  She says she has been a runner since about age 54 through high school and in college.  She also ran marathons.  She backed off from the somewhat when she developed an eye problem that was worsened by her running around 2014 or 2015 at the time when she had her first child.  She is now contemplating having another child.  She has been struggling recently with some abdominal pain and is been seen by GI and was also treated for a vaginitis.  Her murmur was heard by her GYN and she was referred for evaluation.  She denies any history of valvular heart disease.  There is no segment heart disease in the family.  She was not told that she had a murmur as a child.  She had her first EKG today that she can recall.  This demonstrated sinus rhythm at 60 with possible left atrial enlargement and right bundle branch block.  Personally reviewed this.  03/26/2017  Mrs. Mohrmann returns today for follow-up.  She underwent an echo for murmur which sounded slightly louder than a physiologic murmur.  It was loudest in the pulmonic area.  Echo however was normal with no significant valvular abnormalities and vigorous diastolic function.  She has reported atypical chest discomfort which comes and goes and seems to be worse when she is under stress.  I suspect this is musculoskeletal.  She has a long-standing history of very good physical activity including running marathons and her heart is in the condition I would expect for that.  PMHx:  No relevant past cardiac history  FAMHx:  Family  History  Problem Relation Age of Onset  . Colon cancer Neg Hx   . Heart disease Neg Hx   . Stroke Neg Hx   . Diabetes Neg Hx     SOCHx:   reports that  has never smoked. she has never used smokeless tobacco. She reports that she does not drink alcohol or use drugs.  ALLERGIES:  No Known Allergies  ROS: Pertinent items noted in HPI and remainder of comprehensive ROS otherwise negative.  HOME MEDS: No current outpatient medications on file prior to visit.   No current facility-administered medications on file prior to visit.     LABS/IMAGING: No results found for this or any previous visit (from the past 48 hour(s)). No results found.  LIPID PANEL: No results found for: CHOL, TRIG, HDL, CHOLHDL, VLDL, LDLCALC, LDLDIRECT  WEIGHTS: Wt Readings from Last 3 Encounters:  03/26/17 99 lb 6.4 oz (45.1 kg)  02/27/17 97 lb (44 kg)  02/16/17 98 lb 8 oz (44.7 kg)    VITALS: BP 118/70   Pulse 66   Ht 4\' 11"  (1.499 m)   Wt 99 lb 6.4 oz (45.1 kg)   SpO2 99%   BMI 20.08 kg/m   EXAM: Deferred  EKG: Deferred  ASSESSMENT: 1. Murmur 2. Abnormal EKG, possible left atrial enlargement 3. RBBB 4. Atypical chest pain  PLAN: 1.   Mrs. Lengyel returns today for follow-up of  her echo.  I am pleased to report her echo was normal.  She does have a right bundle branch block on EKG however this is likely congenital.  I do not think it relates to any ischemia.  No further workup is necessary.  She had mentioned some atypical chest discomfort.  If not with exertion or relieved by rest and seems to be worse with anxiety.  I suspect this is related to that.  She should monitor it with physical activity.  Follow-up as needed.  Chrystie NoseKenneth C. Hilty, MD, [facility]ACC, FACP  [place]  [facility]  Medical Director of the Advanced Lipid Disorders &  Cardiovascular Risk Reduction Clinic Diplomate of the American Board of Clinical Lipidology Attending Cardiologist  Direct Dial: [phone]  Fax:  [phone]  Website:  [email or web address]  Kenneth C Hilty 03/26/2017, 10:17 AM

## 2017-03-26 NOTE — Progress Notes (Signed)
Subjective:  Patient ID: Makayla Hart, female    DOB: 08/10/1980  Age: 37 y.o. MRN: [id number]  CC: Follow-up   HPI Makayla Hart presents for follow-up of her lower abdominal pain.  It is somewhat improved.  She did have a GI consult and abdominal and pelvic ultrasounds were both normal.  She did see cardiology for her heart murmur and right bundle branch block and they recommended reassurance for her.  She tells me the treatment with Flagyl was somewhat helpful for her.  She has had 3 counseling sessions and is planning to do more.  She is experienced some left sided chest pressure pressure with tingling in her hands and feet.  She admits to being a little sad and frustrated with all the symptoms that she is experiencing.  No outpatient medications prior to visit.   No facility-administered medications prior to visit.     ROS Review of Systems  Constitutional: Negative.   HENT: Negative.   Eyes: Negative.   Respiratory: Negative for shortness of breath and wheezing.   Cardiovascular: Positive for chest pain. Negative for palpitations and leg swelling.  Gastrointestinal: Positive for abdominal pain. Negative for abdominal distention, anal bleeding, blood in stool, constipation, diarrhea, nausea, rectal pain and vomiting.  Genitourinary: Negative.  Negative for vaginal discharge.  Skin: Negative.   Allergic/Immunologic: Negative for immunocompromised state.  Neurological: Negative for weakness and headaches.  Hematological: Does not bruise/bleed easily.    Objective:  BP 110/68 (BP Location: Right Arm, Patient Position: Sitting, Cuff Size: Normal)   Pulse 78   Ht 4\' 11"  (1.499 m)   Wt 98 lb 2 oz (44.5 kg)   SpO2 99%   BMI 19.82 kg/m   BP Readings from Last 3 Encounters:  03/26/17 110/68  03/26/17 118/70  02/27/17 114/72    Wt Readings from Last 3 Encounters:  03/26/17 98 lb 2 oz (44.5 kg)  03/26/17 99 lb 6.4 oz (45.1 kg)  02/27/17 97 lb (44 kg)    Physical Exam    Constitutional: She is oriented to person, place, and time. She appears well-developed and well-nourished. No distress.  HENT:  Head: Normocephalic and atraumatic.  Right Ear: External ear normal.  Left Ear: External ear normal.  Eyes: Conjunctivae are normal. Right eye exhibits no discharge. Left eye exhibits no discharge. No scleral icterus.  Neck: Neck supple. No JVD present. No tracheal deviation present. No thyromegaly present.  Cardiovascular: Normal rate, regular rhythm and normal heart sounds.  Pulmonary/Chest: Effort normal and breath sounds normal. No stridor. No respiratory distress. She has no wheezes.  Abdominal: Bowel sounds are normal.  Lymphadenopathy:    She has no cervical adenopathy.  Neurological: She is alert and oriented to person, place, and time.  Skin: Skin is warm and dry. She is not diaphoretic.  Psychiatric: She has a normal mood and affect. Her behavior is normal.    Lab Results  Component Value Date   WBC 4.6 02/02/2017   HGB 13.3 02/02/2017   HCT 40.3 02/02/2017   PLT 229.0 02/02/2017   GLUCOSE 97 02/02/2017   ALT 14 02/02/2017   AST 15 02/02/2017   NA 138 02/02/2017   K 5.0 02/02/2017   CL 102 02/02/2017   CREATININE 0.56 02/02/2017   BUN 10 02/02/2017   CO2 29 02/02/2017    US Abdomen Complete  Result Date: 03/08/2017 CLINICAL DATA:  Lower abdominal pain for 5 months. EXAM: ABDOMEN ULTRASOUND COMPLETE COMPARISON:  None. FINDINGS: Gallbladder: No gallstones or wall  thickening visualized. No sonographic Murphy sign noted by sonographer. Common bile duct: Diameter: 3 mm Liver: No focal lesion identified. Within normal limits in parenchymal echogenicity. Portal vein is patent on color Doppler imaging with normal direction of blood flow towards the liver. IVC: No abnormality visualized. Pancreas: Visualized portion unremarkable. Spleen: Size and appearance within normal limits. Right Kidney: Length: 10.3 cm. Echogenicity within normal limits. No mass  or hydronephrosis visualized. Left Kidney: Length: 10.0 cm. Echogenicity within normal limits. No mass or hydronephrosis visualized. Abdominal aorta: No aneurysm visualized. Other findings: None. IMPRESSION: Unremarkable abdominal ultrasound. Electronically Signed   By: Sebastian AcheAllen  Grady M.D.   On: 03/08/2017 10:02    Assessment & Plan:   Makayla Hart was seen today for follow-up.  Diagnoses and all orders for this visit:  Strain of abdominal wall, initial encounter -     Ambulatory referral to Physical Therapy   [name] does not currently have medications on file.  No orders of the defined types were placed in this encounter.  Once he has offered medical treatment and the patient declines.  She will continue with counseling.  She agrees to see physical therapy for core strengthening.  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  In the past associated with [employer].  She will let us know who that is and we will refer her there.  Follow-up: Return in about 3 months (around 06/26/2017).  Mliss SaxWilliam Alfred Ladamien Rammel, MD

## 2017-03-26 NOTE — Patient Instructions (Signed)
Medication Instructions:  Continue current medications  If you need a refill on your cardiac medications before your next appointment, please call your pharmacy.  Labwork: None Ordered  Testing/Procedures: None Ordered  Follow-Up: Your physician wants you to follow-up in: As Needed.      Thank you for choosing [facility]!!       

## 2017-04-05 ENCOUNTER — Ambulatory Visit: Payer: 59 | Admitting: Psychology

## 2017-04-20 ENCOUNTER — Ambulatory Visit: Payer: 59 | Admitting: Psychology

## 2017-04-20 DIAGNOSIS — F332 Major depressive disorder, recurrent severe without psychotic features: Secondary | ICD-10-CM

## 2017-05-10 ENCOUNTER — Ambulatory Visit: Payer: 59 | Admitting: Psychology

## 2018-05-08 ENCOUNTER — Telehealth: Payer: Self-pay | Admitting: Family Medicine

## 2018-05-08 NOTE — Telephone Encounter (Signed)
Called but could not leave vm. Calling to schedule virtual visit with Makayla Hart.

## 2020-01-09 IMAGING — US US ABDOMEN COMPLETE
1 series · 14 of 25 positions shown · non-contrast
Comparison: None.

CLINICAL DATA: Lower abdominal pain for 5 months.

EXAM:
ABDOMEN ULTRASOUND COMPLETE

[Series 1: us abdomen complete · 0.11mm/px · 14 of 89 slices shown]
[im 1/89]
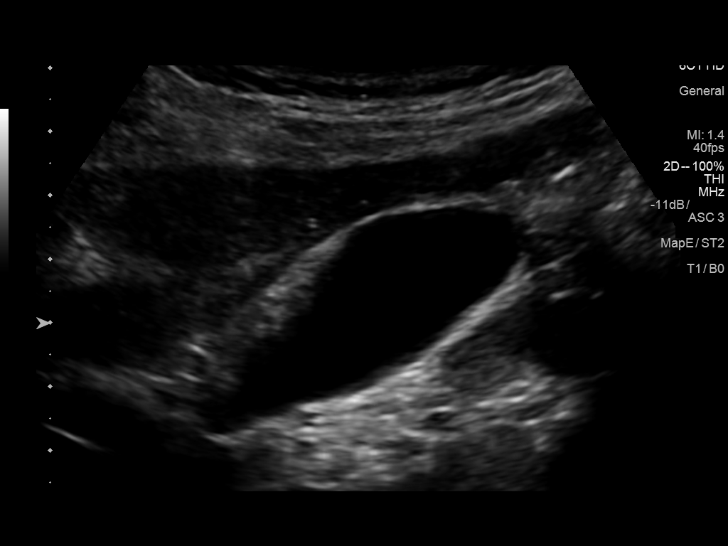
[im 8/89]
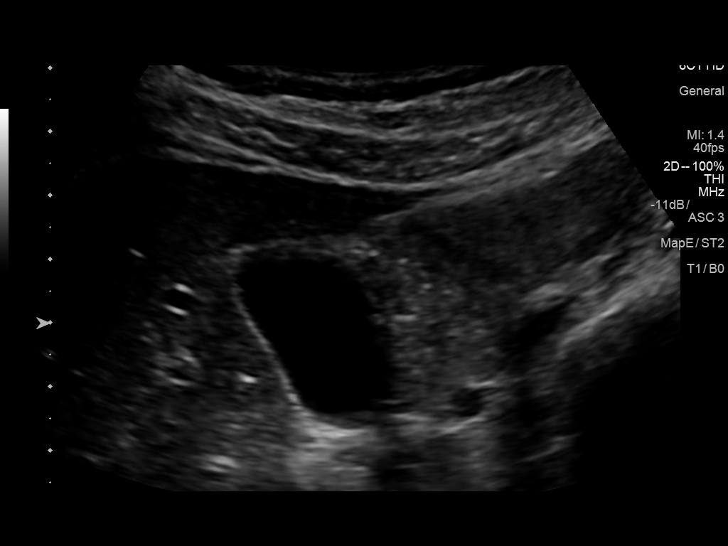
[im 15/89]
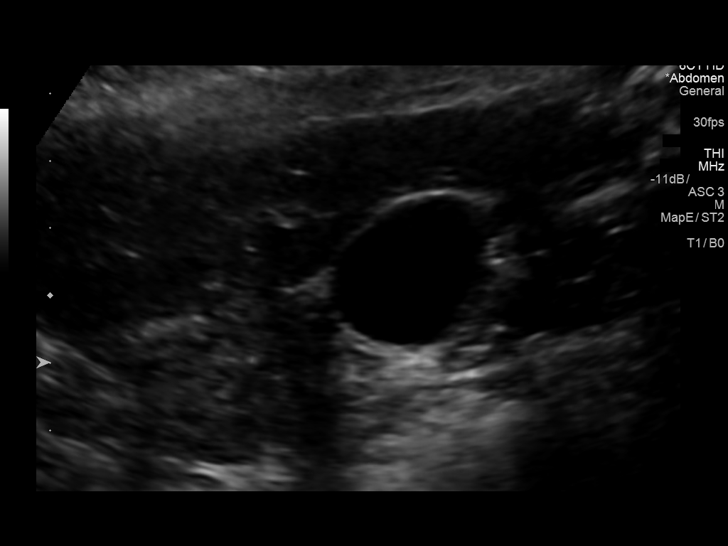
[im 23/89]
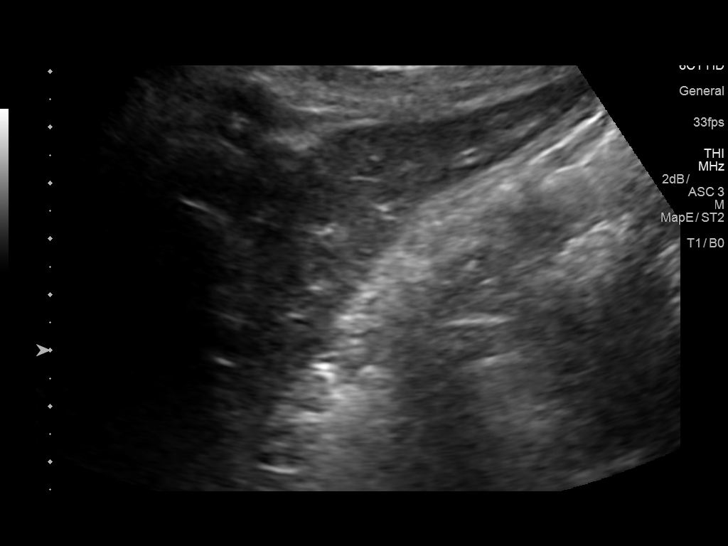
[im 30/89]
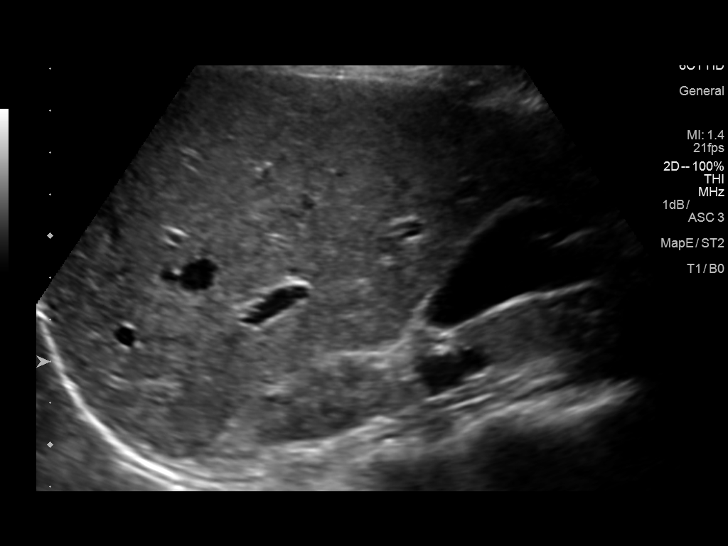
[im 34/89]
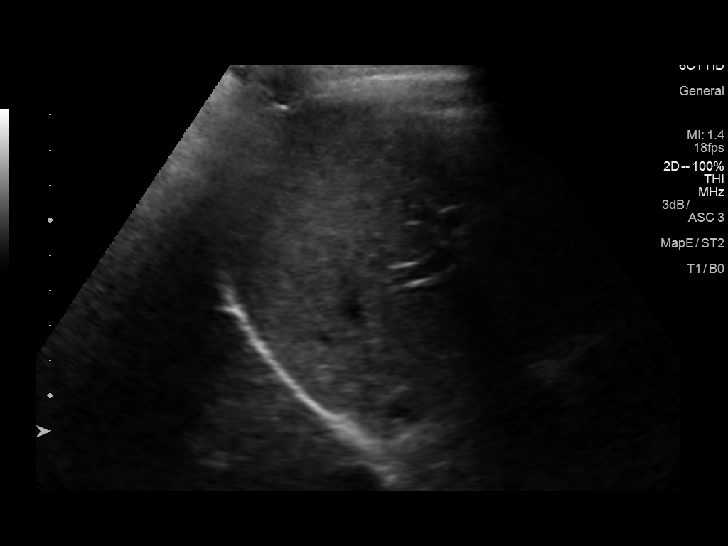
[im 41/89]
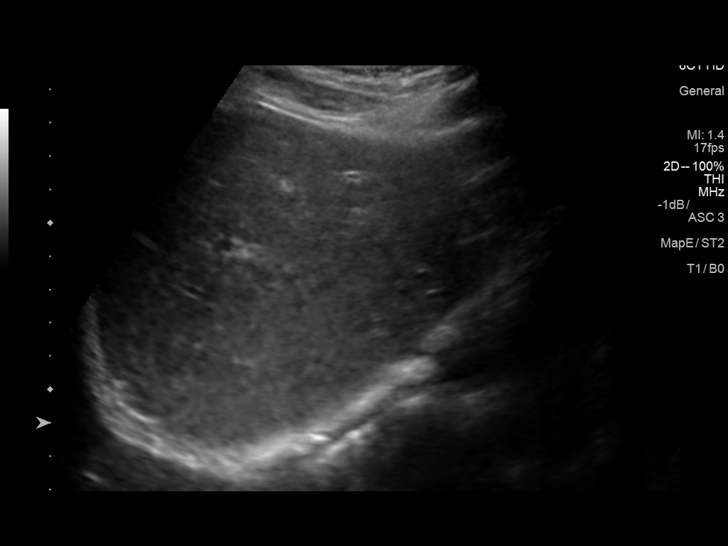
[im 48/89]
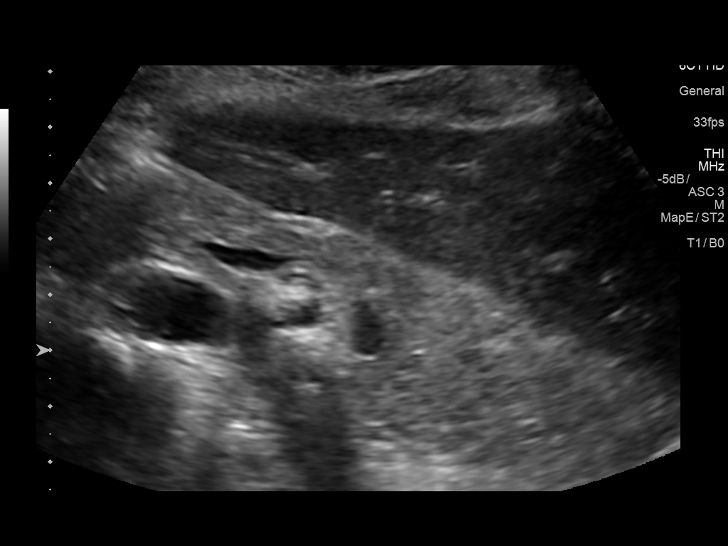
[im 56/89]
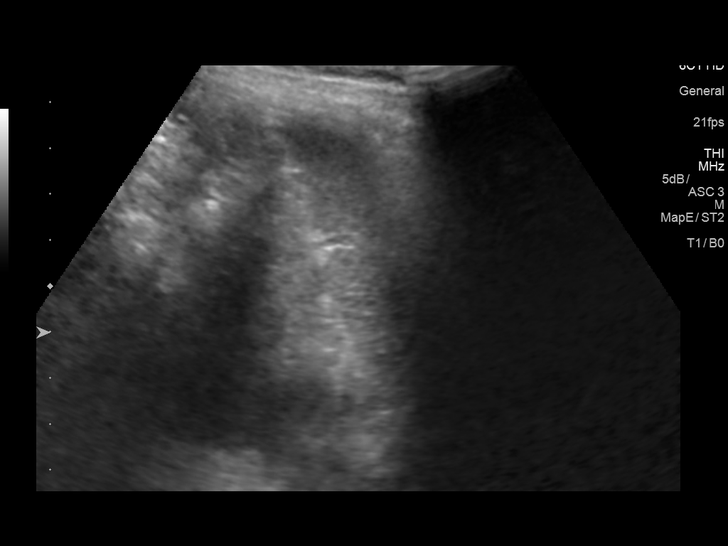
[im 59/89]
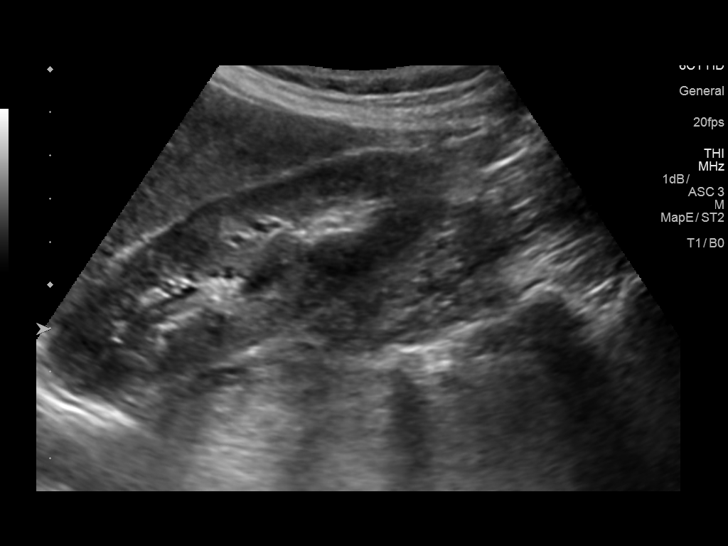
[im 67/89]
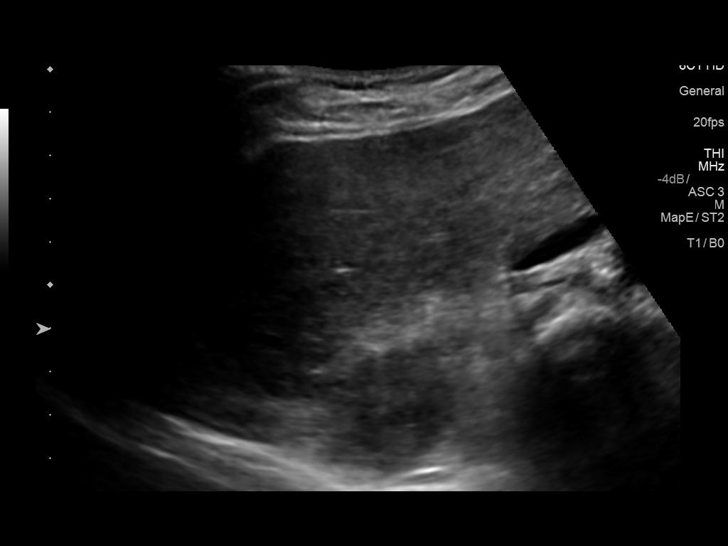
[im 74/89]
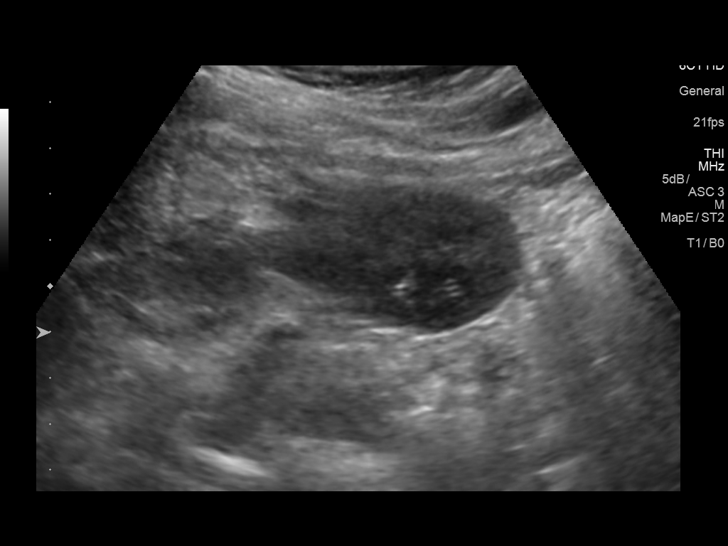
[im 81/89]
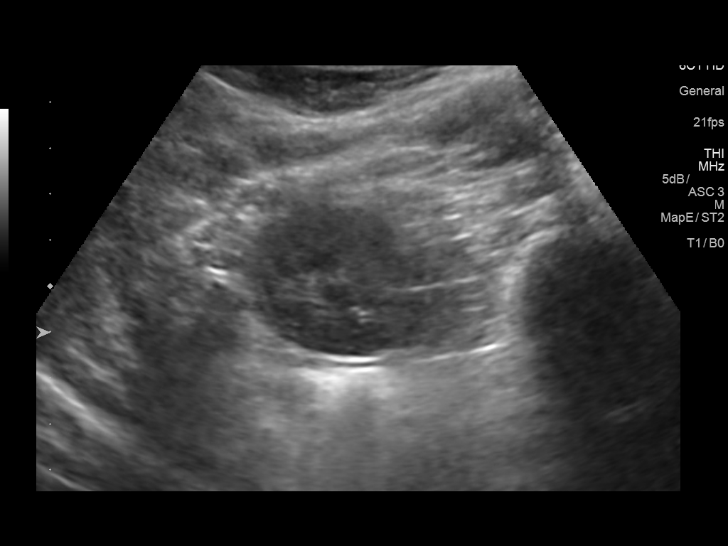
[im 89/89]
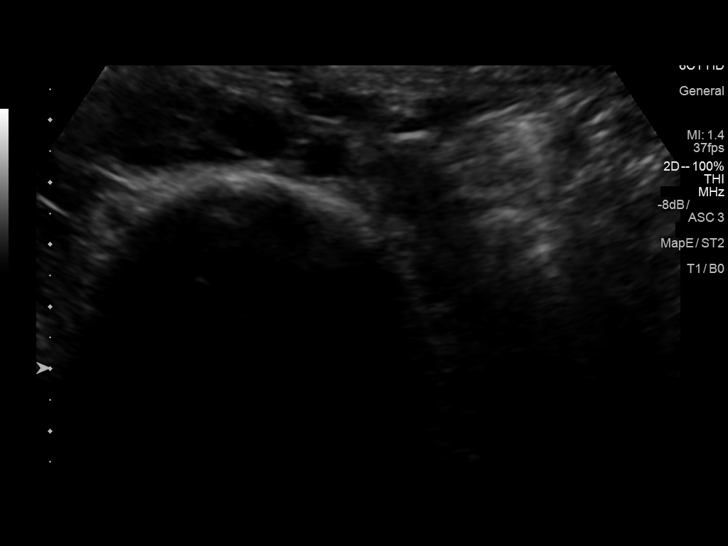

[14 of 25 positions shown; findings below may reference images not displayed]

FINDINGS: Gallbladder: No gallstones or wall thickening visualized. No
sonographic Murphy sign noted by sonographer.

Common bile duct: Diameter: 3 mm

Liver: No focal lesion identified. Within normal limits in
parenchymal echogenicity. Portal vein is patent on color Doppler
imaging with normal direction of blood flow towards the liver.

IVC: No abnormality visualized.

Pancreas: Visualized portion unremarkable.

Spleen: Size and appearance within normal limits.

Right Kidney: Length: 10.3 cm. Echogenicity within normal limits. No
mass or hydronephrosis visualized.

Left Kidney: Length: 10.0 cm. Echogenicity within normal limits. No
mass or hydronephrosis visualized.

Abdominal aorta: No aneurysm visualized.

Other findings: None.
IMPRESSION: Unremarkable abdominal ultrasound.
